# Patient Record
Sex: Female | Born: 1980 | Race: White | Hispanic: No | Marital: Married | Smoking: Never smoker
Health system: Southern US, Community
[De-identification: ages and names within clinical notes are randomized; demographics above are authoritative.]

## PROBLEM LIST (undated history)

## (undated) DIAGNOSIS — E039 Hypothyroidism, unspecified: Secondary | ICD-10-CM

## (undated) HISTORY — PX: KNEE SURGERY: SHX244

## (undated) HISTORY — PX: CYST EXCISION: SHX5701

---
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
## 2006-04-06 ENCOUNTER — Emergency Department (HOSPITAL_COMMUNITY): Admission: EM | Admit: 2006-04-06 | Discharge: 2006-04-06 | Payer: Self-pay | Admitting: Emergency Medicine

## 2006-06-09 ENCOUNTER — Encounter (INDEPENDENT_AMBULATORY_CARE_PROVIDER_SITE_OTHER): Payer: Self-pay | Admitting: *Deleted

## 2006-06-09 ENCOUNTER — Ambulatory Visit (HOSPITAL_BASED_OUTPATIENT_CLINIC_OR_DEPARTMENT_OTHER): Admission: RE | Admit: 2006-06-09 | Discharge: 2006-06-09 | Payer: Self-pay | Admitting: Surgery

## 2006-12-16 ENCOUNTER — Emergency Department (HOSPITAL_COMMUNITY): Admission: EM | Admit: 2006-12-16 | Discharge: 2006-12-16 | Payer: Self-pay | Admitting: Emergency Medicine

## 2007-07-27 ENCOUNTER — Ambulatory Visit: Payer: Self-pay | Admitting: Oncology

## 2007-08-06 LAB — CBC WITH DIFFERENTIAL/PLATELET
BASO%: 0.5 % (ref 0.0–2.0)
Basophils Absolute: 0 10*3/uL (ref 0.0–0.1)
EOS%: 1.6 % (ref 0.0–7.0)
Eosinophils Absolute: 0.1 10*3/uL (ref 0.0–0.5)
HCT: 37 % (ref 34.8–46.6)
HGB: 13.2 g/dL (ref 11.6–15.9)
LYMPH%: 28.4 % (ref 14.0–48.0)
MCH: 33.1 pg (ref 26.0–34.0)
MCHC: 35.7 g/dL (ref 32.0–36.0)
MCV: 92.6 fL (ref 81.0–101.0)
MONO#: 0.5 10*3/uL (ref 0.1–0.9)
MONO%: 7.2 % (ref 0.0–13.0)
NEUT#: 4.7 10*3/uL (ref 1.5–6.5)
NEUT%: 62.3 % (ref 39.6–76.8)
Platelets: 292 10*3/uL (ref 145–400)
RBC: 4 10*6/uL (ref 3.70–5.32)
RDW: 11.5 % (ref 11.3–14.5)
WBC: 7.6 10*3/uL (ref 3.9–10.0)
lymph#: 2.1 10*3/uL (ref 0.9–3.3)

## 2007-08-06 LAB — COMPREHENSIVE METABOLIC PANEL
ALT: 20 U/L (ref 0–35)
AST: 19 U/L (ref 0–37)
Albumin: 4.6 g/dL (ref 3.5–5.2)
Alkaline Phosphatase: 44 U/L (ref 39–117)
BUN: 12 mg/dL (ref 6–23)
CO2: 24 mEq/L (ref 19–32)
Calcium: 9.5 mg/dL (ref 8.4–10.5)
Chloride: 102 mEq/L (ref 96–112)
Creatinine, Ser: 0.7 mg/dL (ref 0.40–1.20)
Glucose, Bld: 87 mg/dL (ref 70–99)
Potassium: 3.8 mEq/L (ref 3.5–5.3)
Sodium: 138 mEq/L (ref 135–145)
Total Bilirubin: 0.4 mg/dL (ref 0.3–1.2)
Total Protein: 7.8 g/dL (ref 6.0–8.3)

## 2007-08-06 LAB — LACTATE DEHYDROGENASE: LDH: 153 U/L (ref 94–250)

## 2007-08-06 LAB — SEDIMENTATION RATE: Sed Rate: 13 mm/hr (ref 0–22)

## 2007-08-06 LAB — CHCC SMEAR

## 2010-10-19 NOTE — Op Note (Signed)
NAMEJESSICIA, Bonnie Holland             ACCOUNT NO.:  [id number]   MEDICAL RECORD NO.:  [id number]          PATIENT TYPE:  AMB   LOCATION:  DSC                          FACILITY:  [facility]   PHYSICIAN:  Wilmon Arms. Corliss Skains, M.D. DATE OF BIRTH:  [birth year]   DATE OF PROCEDURE:  06/09/2006  DATE OF DISCHARGE:                               OPERATIVE REPORT   PREOPERATIVE DIAGNOSIS:  Recurrent right axillary hidradenitis.   POSTOPERATIVE DIAGNOSIS:  Recurrent right axillary hidradenitis.   PROCEDURE PERFORMED:  Excision of right axillary hidradenitis.   SURGEON:  Wilmon Arms. Tsuei, M.D.   ANESTHESIA:  General.   INDICATIONS:  The patient is a 30 year old female, who has had recurrent  subcutaneous abscesses in both axillae for several months.  These were  treated conservatively with local drainage and p.o. doxycycline.  The  left axilla seems to have cleared up; however, the right axilla  continues to have recurrent abscesses.  She has developed tracking  sinuses posteriorly.  We recommended wide excision of all the infected  axillary tissue.   DESCRIPTION OF PROCEDURE:  The patient was brought to the operating room  and placed in supine position on the operating room table.  After an  adequate level of general anesthesia was obtained, the patient's right  axilla was prepped with Betadine and draped in sterile fashion.  Two  separate indurated areas were identified.  More anteriorly, the skin  sinuses were draining purulent material.  Posteriorly, there was seen to  be some indurated tissue.  A 3-inch incision was made in elliptical  fashion around all of the infected tissue.  Dissection was carried down  into the subcutaneous tissues with cautery.  Wide excision of the  axillary contents was performed around all of the chronically granulated  tissue.  This was continued down to the edge of the pectoralis major.  This specimen was sent for pathologic examination.  The wound was then  thoroughly irrigated and hemostasis was obtained with cautery.  The  wound was closed with a deep layer of 3-0 Vicryl in running fashion.  Horizontal mattress sutures of 3-0 nylon were used to close the skin.  Xeroform and a clean dressing were applied.  The patient was extubated  and brought to the recovery room in stable condition.  All sponge,  instrument and needle counts were correct.      Wilmon Arms. Tsuei, M.D.  Electronically Signed     MKT/MEDQ  D:  06/09/2006  T:  06/09/2006  Job:  [id number]

## 2013-05-11 LAB — OB RESULTS CONSOLE RPR: RPR: NONREACTIVE

## 2013-05-11 LAB — OB RESULTS CONSOLE RUBELLA ANTIBODY, IGM: Rubella: IMMUNE

## 2013-05-11 LAB — OB RESULTS CONSOLE GC/CHLAMYDIA
Chlamydia: NEGATIVE
Gonorrhea: NEGATIVE

## 2013-05-11 LAB — OB RESULTS CONSOLE HEPATITIS B SURFACE ANTIGEN: Hepatitis B Surface Ag: NEGATIVE

## 2013-05-11 LAB — OB RESULTS CONSOLE HIV ANTIBODY (ROUTINE TESTING): HIV: NONREACTIVE

## 2013-05-11 LAB — OB RESULTS CONSOLE ANTIBODY SCREEN: Antibody Screen: NEGATIVE

## 2013-05-11 LAB — OB RESULTS CONSOLE ABO/RH: RH Type: NEGATIVE

## 2013-06-03 NOTE — L&D Delivery Note (Signed)
SVD of VMI at 0050 on 12/14/13.  EBL 300cc.  APGARs 7,9.  Placenta to L&D. NICU was in attendance secondary to chorio and thick, particulate meconium.  Head delivered LOA and body followed atraumatically.  Cord was clamped, cut and baby handed off to NICU at warmer for suction.  Cord gas and blood obtained.  Cord blood was donated.  Placenta delivered S/I/3VC.  Uterine inversion was noted with placental delivery and was promptly reduced within seconds.  2nd degree perineal laceration was repaired with 3-0 Vicryl in the normal fashion.  Mom and baby stable.    Mitchel HonourMegan Xana Bradt, DO

## 2013-06-09 ENCOUNTER — Inpatient Hospital Stay (HOSPITAL_COMMUNITY): Admission: AD | Admit: 2013-06-09 | Payer: Self-pay | Source: Ambulatory Visit | Admitting: Obstetrics and Gynecology

## 2013-07-05 ENCOUNTER — Other Ambulatory Visit: Payer: Self-pay

## 2013-07-08 ENCOUNTER — Other Ambulatory Visit (HOSPITAL_COMMUNITY): Payer: Self-pay | Admitting: Obstetrics and Gynecology

## 2013-07-08 DIAGNOSIS — R772 Abnormality of alphafetoprotein: Secondary | ICD-10-CM

## 2013-07-16 ENCOUNTER — Encounter (HOSPITAL_COMMUNITY): Payer: Self-pay

## 2013-07-16 ENCOUNTER — Ambulatory Visit (HOSPITAL_COMMUNITY)
Admission: RE | Admit: 2013-07-16 | Discharge: 2013-07-16 | Disposition: A | Discharge status: Home / Self Care | Payer: BC Managed Care – PPO | Source: Ambulatory Visit | Attending: Obstetrics and Gynecology | Admitting: Obstetrics and Gynecology

## 2013-07-16 ENCOUNTER — Ambulatory Visit (HOSPITAL_COMMUNITY): Admission: RE | Admit: 2013-07-16 | Payer: BC Managed Care – PPO | Source: Ambulatory Visit

## 2013-07-16 DIAGNOSIS — O358XX0 Maternal care for other (suspected) fetal abnormality and damage, not applicable or unspecified: Secondary | ICD-10-CM | POA: Insufficient documentation

## 2013-07-16 DIAGNOSIS — R772 Abnormality of alphafetoprotein: Secondary | ICD-10-CM

## 2013-07-16 DIAGNOSIS — O289 Unspecified abnormal findings on antenatal screening of mother: Secondary | ICD-10-CM | POA: Insufficient documentation

## 2013-07-16 DIAGNOSIS — O3500X0 Maternal care for (suspected) central nervous system malformation or damage in fetus, unspecified, not applicable or unspecified: Secondary | ICD-10-CM | POA: Insufficient documentation

## 2013-07-16 DIAGNOSIS — O350XX0 Maternal care for (suspected) central nervous system malformation in fetus, not applicable or unspecified: Secondary | ICD-10-CM | POA: Insufficient documentation

## 2013-09-07 ENCOUNTER — Other Ambulatory Visit (HOSPITAL_COMMUNITY): Payer: Self-pay | Admitting: Obstetrics and Gynecology

## 2013-09-07 DIAGNOSIS — O350XX0 Maternal care for (suspected) central nervous system malformation in fetus, not applicable or unspecified: Secondary | ICD-10-CM

## 2013-09-07 DIAGNOSIS — O3500X0 Maternal care for (suspected) central nervous system malformation or damage in fetus, unspecified, not applicable or unspecified: Secondary | ICD-10-CM

## 2013-09-07 DIAGNOSIS — O289 Unspecified abnormal findings on antenatal screening of mother: Secondary | ICD-10-CM

## 2013-09-10 ENCOUNTER — Encounter (HOSPITAL_COMMUNITY): Payer: Self-pay

## 2013-09-10 ENCOUNTER — Other Ambulatory Visit (HOSPITAL_COMMUNITY): Payer: Self-pay | Admitting: Maternal and Fetal Medicine

## 2013-09-10 ENCOUNTER — Ambulatory Visit (HOSPITAL_COMMUNITY)
Admission: RE | Admit: 2013-09-10 | Discharge: 2013-09-10 | Disposition: A | Discharge status: Home / Self Care | Payer: BC Managed Care – PPO | Source: Ambulatory Visit | Attending: Obstetrics and Gynecology | Admitting: Obstetrics and Gynecology

## 2013-09-10 VITALS — BP 115/62 | HR 87 | Wt 201.0 lb

## 2013-09-10 DIAGNOSIS — O350XX0 Maternal care for (suspected) central nervous system malformation in fetus, not applicable or unspecified: Secondary | ICD-10-CM

## 2013-09-10 DIAGNOSIS — O3500X0 Maternal care for (suspected) central nervous system malformation or damage in fetus, unspecified, not applicable or unspecified: Secondary | ICD-10-CM

## 2013-09-10 DIAGNOSIS — O289 Unspecified abnormal findings on antenatal screening of mother: Secondary | ICD-10-CM

## 2013-10-22 ENCOUNTER — Ambulatory Visit (HOSPITAL_COMMUNITY)
Admission: RE | Admit: 2013-10-22 | Discharge: 2013-10-22 | Disposition: A | Discharge status: Home / Self Care | Payer: BC Managed Care – PPO | Source: Ambulatory Visit | Attending: Obstetrics and Gynecology | Admitting: Obstetrics and Gynecology

## 2013-10-22 ENCOUNTER — Encounter (HOSPITAL_COMMUNITY): Payer: Self-pay

## 2013-10-22 DIAGNOSIS — O289 Unspecified abnormal findings on antenatal screening of mother: Secondary | ICD-10-CM | POA: Insufficient documentation

## 2013-10-22 DIAGNOSIS — Z3689 Encounter for other specified antenatal screening: Secondary | ICD-10-CM | POA: Insufficient documentation

## 2013-11-26 LAB — OB RESULTS CONSOLE GBS: GBS: POSITIVE

## 2013-12-12 ENCOUNTER — Inpatient Hospital Stay (HOSPITAL_COMMUNITY)
Admission: AD | Admit: 2013-12-12 | Discharge: 2013-12-15 | DRG: 775 | Disposition: A | Discharge status: Home / Self Care | Payer: BC Managed Care – PPO | Source: Ambulatory Visit | Attending: Obstetrics & Gynecology | Admitting: Obstetrics & Gynecology

## 2013-12-12 ENCOUNTER — Encounter (HOSPITAL_COMMUNITY): Payer: Self-pay | Admitting: *Deleted

## 2013-12-12 DIAGNOSIS — O99284 Endocrine, nutritional and metabolic diseases complicating childbirth: Secondary | ICD-10-CM

## 2013-12-12 DIAGNOSIS — Z22330 Carrier of Group B streptococcus: Secondary | ICD-10-CM

## 2013-12-12 DIAGNOSIS — E079 Disorder of thyroid, unspecified: Secondary | ICD-10-CM | POA: Diagnosis present

## 2013-12-12 DIAGNOSIS — O99892 Other specified diseases and conditions complicating childbirth: Secondary | ICD-10-CM | POA: Diagnosis present

## 2013-12-12 DIAGNOSIS — E039 Hypothyroidism, unspecified: Secondary | ICD-10-CM | POA: Diagnosis present

## 2013-12-12 DIAGNOSIS — O9989 Other specified diseases and conditions complicating pregnancy, childbirth and the puerperium: Secondary | ICD-10-CM

## 2013-12-12 DIAGNOSIS — O479 False labor, unspecified: Secondary | ICD-10-CM | POA: Diagnosis present

## 2013-12-12 DIAGNOSIS — Z3490 Encounter for supervision of normal pregnancy, unspecified, unspecified trimester: Secondary | ICD-10-CM

## 2013-12-12 HISTORY — DX: Hypothyroidism, unspecified: E03.9

## 2013-12-12 LAB — CBC
HCT: 36.9 % (ref 36.0–46.0)
Hemoglobin: 13 g/dL (ref 12.0–15.0)
MCH: 31.5 pg (ref 26.0–34.0)
MCHC: 35.2 g/dL (ref 30.0–36.0)
MCV: 89.3 fL (ref 78.0–100.0)
Platelets: 238 10*3/uL (ref 150–400)
RBC: 4.13 MIL/uL (ref 3.87–5.11)
RDW: 12.7 % (ref 11.5–15.5)
WBC: 16.2 10*3/uL — ABNORMAL HIGH (ref 4.0–10.5)

## 2013-12-12 LAB — POCT FERN TEST: POCT Fern Test: POSITIVE

## 2013-12-12 MED ORDER — ONDANSETRON HCL 4 MG/2ML IJ SOLN
4.0000 mg | Freq: Four times a day (QID) | INTRAMUSCULAR | Status: DC | PRN
Start: 2013-12-12 — End: 2013-12-14
  Administered 2013-12-13 (×2): 4 mg via INTRAVENOUS
  Filled 2013-12-12 (×2): qty 2

## 2013-12-12 MED ORDER — PENICILLIN G POTASSIUM 5000000 UNITS IJ SOLR
2.5000 10*6.[IU] | INTRAVENOUS | Status: DC
  Administered 2013-12-13 (×6): 2.5 10*6.[IU] via INTRAVENOUS
  Filled 2013-12-12 (×11): qty 2.5

## 2013-12-12 MED ORDER — IBUPROFEN 600 MG PO TABS: 600.0000 mg | ORAL_TABLET | Freq: Four times a day (QID) | ORAL | Status: DC | PRN

## 2013-12-12 MED ORDER — LACTATED RINGERS IV SOLN: 500.0000 mL | INTRAVENOUS | Status: DC | PRN

## 2013-12-12 MED ORDER — OXYTOCIN 40 UNITS IN LACTATED RINGERS INFUSION - SIMPLE MED
62.5000 mL/h | INTRAVENOUS | Status: DC
  Administered 2013-12-14: 62.5 mL/h via INTRAVENOUS

## 2013-12-12 MED ORDER — LACTATED RINGERS IV SOLN
INTRAVENOUS | Status: DC
  Administered 2013-12-12 – 2013-12-13 (×4): via INTRAVENOUS

## 2013-12-12 MED ORDER — LIDOCAINE HCL (PF) 1 % IJ SOLN
30.0000 mL | INTRAMUSCULAR | Status: DC | PRN
  Filled 2013-12-12: qty 30

## 2013-12-12 MED ORDER — PENICILLIN G POTASSIUM 5000000 UNITS IJ SOLR
5.0000 10*6.[IU] | Freq: Once | INTRAMUSCULAR | Status: AC
  Administered 2013-12-12: 5 10*6.[IU] via INTRAVENOUS
  Filled 2013-12-12: qty 5

## 2013-12-12 MED ORDER — CITRIC ACID-SODIUM CITRATE 334-500 MG/5ML PO SOLN: 30.0000 mL | ORAL | Status: DC | PRN

## 2013-12-12 MED ORDER — OXYTOCIN BOLUS FROM INFUSION
500.0000 mL | INTRAVENOUS | Status: DC
  Administered 2013-12-14: 500 mL via INTRAVENOUS

## 2013-12-12 MED ORDER — ACETAMINOPHEN 325 MG PO TABS: 650.0000 mg | ORAL_TABLET | ORAL | Status: DC | PRN

## 2013-12-12 NOTE — MAU Note (Signed)
Pt reports ROM at 1845

## 2013-12-13 ENCOUNTER — Encounter (HOSPITAL_COMMUNITY): Payer: Self-pay

## 2013-12-13 ENCOUNTER — Inpatient Hospital Stay (HOSPITAL_COMMUNITY): Payer: BC Managed Care – PPO | Admitting: Anesthesiology

## 2013-12-13 ENCOUNTER — Encounter (HOSPITAL_COMMUNITY): Payer: BC Managed Care – PPO | Admitting: Anesthesiology

## 2013-12-13 LAB — RPR

## 2013-12-13 MED ORDER — GENTAMICIN SULFATE 40 MG/ML IJ SOLN
1.5000 mg/kg | Freq: Three times a day (TID) | INTRAVENOUS | Status: DC
  Administered 2013-12-13: 140 mg via INTRAVENOUS
  Filled 2013-12-13 (×3): qty 3.5

## 2013-12-13 MED ORDER — SODIUM CHLORIDE 0.9 % IV SOLN
2.0000 g | Freq: Four times a day (QID) | INTRAVENOUS | Status: DC
  Administered 2013-12-13: 2 g via INTRAVENOUS
  Filled 2013-12-13 (×3): qty 2000

## 2013-12-13 MED ORDER — FENTANYL 2.5 MCG/ML BUPIVACAINE 1/10 % EPIDURAL INFUSION (WH - ANES)
14.0000 mL/h | INTRAMUSCULAR | Status: DC | PRN
  Administered 2013-12-13 (×4): 14 mL/h via EPIDURAL
  Filled 2013-12-13 (×4): qty 125

## 2013-12-13 MED ORDER — OXYTOCIN 40 UNITS IN LACTATED RINGERS INFUSION - SIMPLE MED
1.0000 m[IU]/min | INTRAVENOUS | Status: DC
  Administered 2013-12-13: 2 m[IU]/min via INTRAVENOUS
  Filled 2013-12-13: qty 1000

## 2013-12-13 MED ORDER — PHENYLEPHRINE 40 MCG/ML (10ML) SYRINGE FOR IV PUSH (FOR BLOOD PRESSURE SUPPORT)
80.0000 ug | PREFILLED_SYRINGE | INTRAVENOUS | Status: DC | PRN
  Filled 2013-12-13: qty 2

## 2013-12-13 MED ORDER — LACTATED RINGERS IV SOLN
500.0000 mL | Freq: Once | INTRAVENOUS | Status: AC
  Administered 2013-12-13: 500 mL via INTRAVENOUS

## 2013-12-13 MED ORDER — PHENYLEPHRINE 40 MCG/ML (10ML) SYRINGE FOR IV PUSH (FOR BLOOD PRESSURE SUPPORT)
80.0000 ug | PREFILLED_SYRINGE | INTRAVENOUS | Status: DC | PRN
  Filled 2013-12-13: qty 2
  Filled 2013-12-13: qty 10

## 2013-12-13 MED ORDER — DIPHENHYDRAMINE HCL 50 MG/ML IJ SOLN: 12.5000 mg | INTRAMUSCULAR | Status: DC | PRN

## 2013-12-13 MED ORDER — EPHEDRINE 5 MG/ML INJ
10.0000 mg | INTRAVENOUS | Status: DC | PRN
  Filled 2013-12-13: qty 2

## 2013-12-13 MED ORDER — LACTATED RINGERS IV SOLN
INTRAVENOUS | Status: DC
Start: 2013-12-13 — End: 2013-12-14
  Administered 2013-12-13 (×3): via INTRAUTERINE

## 2013-12-13 MED ORDER — LIDOCAINE HCL (PF) 1 % IJ SOLN
INTRAMUSCULAR | Status: DC | PRN
  Administered 2013-12-13 (×2): 5 mL

## 2013-12-13 MED ORDER — TERBUTALINE SULFATE 1 MG/ML IJ SOLN: 0.2500 mg | Freq: Once | INTRAMUSCULAR | Status: AC | PRN

## 2013-12-13 NOTE — Anesthesia Procedure Notes (Signed)
Epidural Patient location during procedure: OB Start time: 12/13/2013 5:20 AM  Staffing Anesthesiologist: Brayton CavesJACKSON, Brinae Woods Performed by: anesthesiologist   Preanesthetic Checklist Completed: patient identified, site marked, surgical consent, pre-op evaluation, timeout performed, IV checked, risks and benefits discussed and monitors and equipment checked  Epidural Patient position: sitting Prep: site prepped and draped and DuraPrep Patient monitoring: continuous pulse ox and blood pressure Approach: midline Location: L3-L4 Injection technique: LOR air  Needle:  Needle type: Tuohy  Needle gauge: 17 G Needle length: 9 cm and 9 Needle insertion depth: 6 cm Catheter type: closed end flexible Catheter size: 19 Gauge Catheter at skin depth: 10 cm Test dose: negative  Assessment Events: blood not aspirated, injection not painful, no injection resistance, negative IV test and no paresthesia  Additional Notes Patient identified.  Risk benefits discussed including failed block, incomplete pain control, headache, nerve damage, paralysis, blood pressure changes, nausea, vomiting, reactions to medication both toxic or allergic, and postpartum back pain.  Patient expressed understanding and wished to proceed.  All questions were answered.  Sterile technique used throughout procedure and epidural site dressed with sterile barrier dressing. No paresthesia or other complications noted.The patient did not experience any signs of intravascular injection such as tinnitus or metallic taste in mouth nor signs of intrathecal spread such as rapid motor block. Please see nursing notes for vital signs.

## 2013-12-13 NOTE — Progress Notes (Signed)
Bonnie HaggardKimberly M Holland is a 33 y.o. G1P0000 at 9216w1d by ultrasound admitted for rupture of membranes  Subjective: No c/o.  Comfortable with epidural.  Objective: BP 111/55  Pulse 84  Temp(Src) 98.7 F (37.1 C) (Oral)  Resp 16  Ht 5\' 4"  (1.626 m)  Wt 212 lb (96.163 kg)  BMI 36.37 kg/m2  SpO2 98%  LMP 03/14/2013      FHT:  FHR: 135 bpm, variability: moderate,  accelerations:  Present,  decelerations:  Present early decelerations UC:   regular, every 4 minutes SVE:   Dilation: 1.5 Effacement (%): 70 Station: -2 Exam by:: E.Clapper, RN  Labs: Lab Results  Component Value Date   WBC 16.2* 12/12/2013   HGB 13.0 12/12/2013   HCT 36.9 12/12/2013   MCV 89.3 12/12/2013   PLT 238 12/12/2013    Assessment / Plan: Protracted latent phase  Labor: Will add pitocin Preeclampsia:  n/a Fetal Wellbeing:  Category II Pain Control:  Epidural I/D:  n/a Anticipated MOD:  NSVD  Lyra Alaimo 12/13/2013, 7:42 AM

## 2013-12-13 NOTE — Anesthesia Preprocedure Evaluation (Signed)
Anesthesia Evaluation  Patient identified by MRN, date of birth, ID band Patient awake    Reviewed: Allergy & Precautions, H&P , Patient's Chart, lab work & pertinent test results  Airway Mallampati: III TM Distance: >3 FB Neck ROM: full    Dental   Pulmonary  breath sounds clear to auscultation        Cardiovascular Rhythm:regular Rate:Normal     Neuro/Psych    GI/Hepatic   Endo/Other    Renal/GU      Musculoskeletal   Abdominal   Peds  Hematology   Anesthesia Other Findings   Reproductive/Obstetrics (+) Pregnancy                           Anesthesia Physical Anesthesia Plan  ASA: II  Anesthesia Plan: Epidural   Post-op Pain Management:    Induction:   Airway Management Planned:   Additional Equipment:   Intra-op Plan:   Post-operative Plan:   Informed Consent: I have reviewed the patients History and Physical, chart, labs and discussed the procedure including the risks, benefits and alternatives for the proposed anesthesia with the patient or authorized representative who has indicated his/her understanding and acceptance.     Plan Discussed with:   Anesthesia Plan Comments:         Anesthesia Quick Evaluation  

## 2013-12-13 NOTE — H&P (Signed)
Bonnie HaggardKimberly M Holland is a 33 y.o. female G1P0 @ 39wks presenting for ROM/labor.    History OB History   Grav Para Term Preterm Abortions TAB SAB Ect Mult Living   1 0 0 0 0 0 0 0 0 0      Past Medical History  Diagnosis Date  . Hypothyroidism    Past Surgical History  Procedure Laterality Date  . Knee surgery    . Cyst excision     Family History: family history is not on file. Social History:  reports that she has never smoked. She does not have any smokeless tobacco history on file. She reports that she does not drink alcohol or use illicit drugs.   Prenatal Transfer Tool  Maternal Diabetes: No Genetic Screening: Abnormal:  Results: Elevated AFP, Other: normal cfDNA Maternal Ultrasounds/Referrals: Normal Fetal Ultrasounds or other Referrals:  None Maternal Substance Abuse:  No Significant Maternal Medications:  None Significant Maternal Lab Results:  None Other Comments:  None  ROS  Dilation: Fingertip Effacement (%): 80 Station: -2 Exam by:: Weston,RN Blood pressure 128/72, pulse 80, temperature 98.5 F (36.9 C), temperature source Oral, resp. rate 16, height 5\' 4"  (1.626 m), weight 96.163 kg (212 lb), last menstrual period 03/14/2013. Exam Physical Exam  Gen - NAD Abd - gravid, NT  EFW 7# Ext NT, trace edema bilaterally Cvx 1cm Prenatal labs: ABO, Rh: --/--/B NEG (07/12 1954) Antibody: PENDING (07/12 1954) Rubella: Immune (12/09 0000) RPR: NON REAC (07/12 1954)  HBsAg: Negative (12/09 0000)  HIV: Non-reactive (12/09 0000)  GBS: Positive (06/26 0000)   Assessment/Plan: Labor PCN for GBS prophylaxis Epidural upon request   Noela Brothers 12/13/2013, 3:48 AM

## 2013-12-14 ENCOUNTER — Encounter (HOSPITAL_COMMUNITY): Payer: Self-pay

## 2013-12-14 DIAGNOSIS — Z3490 Encounter for supervision of normal pregnancy, unspecified, unspecified trimester: Secondary | ICD-10-CM

## 2013-12-14 LAB — CBC
HCT: 33.2 % — ABNORMAL LOW (ref 36.0–46.0)
Hemoglobin: 11.5 g/dL — ABNORMAL LOW (ref 12.0–15.0)
MCH: 31.3 pg (ref 26.0–34.0)
MCHC: 34.6 g/dL (ref 30.0–36.0)
MCV: 90.5 fL (ref 78.0–100.0)
Platelets: 179 10*3/uL (ref 150–400)
RBC: 3.67 MIL/uL — ABNORMAL LOW (ref 3.87–5.11)
RDW: 12.8 % (ref 11.5–15.5)
WBC: 27.7 10*3/uL — ABNORMAL HIGH (ref 4.0–10.5)

## 2013-12-14 MED ORDER — ONDANSETRON HCL 4 MG PO TABS: 4.0000 mg | ORAL_TABLET | ORAL | Status: DC | PRN

## 2013-12-14 MED ORDER — ZOLPIDEM TARTRATE 5 MG PO TABS: 5.0000 mg | ORAL_TABLET | Freq: Every evening | ORAL | Status: DC | PRN

## 2013-12-14 MED ORDER — LANOLIN HYDROUS EX OINT: TOPICAL_OINTMENT | CUTANEOUS | Status: DC | PRN

## 2013-12-14 MED ORDER — IBUPROFEN 600 MG PO TABS
600.0000 mg | ORAL_TABLET | Freq: Four times a day (QID) | ORAL | Status: DC
  Administered 2013-12-14 – 2013-12-15 (×6): 600 mg via ORAL
  Filled 2013-12-14 (×6): qty 1

## 2013-12-14 MED ORDER — SENNOSIDES-DOCUSATE SODIUM 8.6-50 MG PO TABS
2.0000 | ORAL_TABLET | ORAL | Status: DC
  Administered 2013-12-14: 2 via ORAL
  Filled 2013-12-14: qty 2

## 2013-12-14 MED ORDER — DIBUCAINE 1 % RE OINT: 1.0000 "application " | TOPICAL_OINTMENT | RECTAL | Status: DC | PRN

## 2013-12-14 MED ORDER — TETANUS-DIPHTH-ACELL PERTUSSIS 5-2.5-18.5 LF-MCG/0.5 IM SUSP: 0.5000 mL | Freq: Once | INTRAMUSCULAR | Status: DC

## 2013-12-14 MED ORDER — WITCH HAZEL-GLYCERIN EX PADS: 1.0000 "application " | MEDICATED_PAD | CUTANEOUS | Status: DC | PRN

## 2013-12-14 MED ORDER — SIMETHICONE 80 MG PO CHEW: 80.0000 mg | CHEWABLE_TABLET | ORAL | Status: DC | PRN

## 2013-12-14 MED ORDER — OXYCODONE-ACETAMINOPHEN 5-325 MG PO TABS: 1.0000 | ORAL_TABLET | ORAL | Status: DC | PRN

## 2013-12-14 MED ORDER — PRENATAL MULTIVITAMIN CH
1.0000 | ORAL_TABLET | Freq: Every day | ORAL | Status: DC
  Administered 2013-12-14 – 2013-12-15 (×2): 1 via ORAL
  Filled 2013-12-14 (×2): qty 1

## 2013-12-14 MED ORDER — BENZOCAINE-MENTHOL 20-0.5 % EX AERO
1.0000 "application " | INHALATION_SPRAY | CUTANEOUS | Status: DC | PRN
  Administered 2013-12-14: 1 via TOPICAL
  Filled 2013-12-14: qty 56

## 2013-12-14 MED ORDER — SODIUM CHLORIDE 0.9 % IV SOLN
2.0000 g | Freq: Once | INTRAVENOUS | Status: AC
  Administered 2013-12-14: 2 g via INTRAVENOUS
  Filled 2013-12-14: qty 2000

## 2013-12-14 MED ORDER — DIPHENHYDRAMINE HCL 25 MG PO CAPS: 25.0000 mg | ORAL_CAPSULE | Freq: Four times a day (QID) | ORAL | Status: DC | PRN

## 2013-12-14 MED ORDER — ONDANSETRON HCL 4 MG/2ML IJ SOLN: 4.0000 mg | INTRAMUSCULAR | Status: DC | PRN

## 2013-12-14 MED ORDER — GENTAMICIN SULFATE 40 MG/ML IJ SOLN
1.5000 mg/kg | Freq: Once | INTRAVENOUS | Status: AC
  Administered 2013-12-14: 140 mg via INTRAVENOUS
  Filled 2013-12-14: qty 3.5

## 2013-12-14 MED ORDER — LEVOTHYROXINE SODIUM 25 MCG PO TABS
25.0000 ug | ORAL_TABLET | Freq: Every day | ORAL | Status: DC
  Administered 2013-12-14 – 2013-12-15 (×2): 25 ug via ORAL
  Filled 2013-12-14 (×3): qty 1

## 2013-12-14 MED ORDER — RHO D IMMUNE GLOBULIN 1500 UNIT/2ML IJ SOSY
300.0000 ug | PREFILLED_SYRINGE | Freq: Once | INTRAMUSCULAR | Status: AC
  Administered 2013-12-14: 300 ug via INTRAMUSCULAR
  Filled 2013-12-14: qty 2

## 2013-12-14 NOTE — Anesthesia Postprocedure Evaluation (Signed)
  Anesthesia Post-op Note  Patient: Bonnie Holland  Procedure(s) Performed: * No procedures listed *  Patient Location: Mother/Baby  Anesthesia Type:Epidural  Level of Consciousness: awake, alert , oriented and patient cooperative  Airway and Oxygen Therapy: Patient Spontanous Breathing  Post-op Pain: none  Post-op Assessment: Post-op Vital signs reviewed, Patient's Cardiovascular Status Stable, Respiratory Function Stable, Patent Airway, No signs of Nausea or vomiting, Adequate PO intake, Pain level controlled, Pain level not controlled and No headache  Post-op Vital Signs: Reviewed and stable  Last Vitals:  Filed Vitals:   12/14/13 0430  BP: 109/75  Pulse: 76  Temp: 36.8 C  Resp: 20    Complications: No apparent anesthesia complications

## 2013-12-14 NOTE — Progress Notes (Signed)
Post Partum Day 0 Subjective: no complaints, up ad lib, voiding and tolerating PO  Objective: Blood pressure 114/69, pulse 76, temperature 97.6 F (36.4 C), temperature source Oral, resp. rate 18, height 5\' 4"  (1.626 m), weight 212 lb (96.163 kg), last menstrual period 03/14/2013, SpO2 98.00%, unknown if currently breastfeeding.  Physical Exam:  General: alert Lochia: appropriate Uterine Fundus: firm Incision: perineum intact DVT Evaluation: No evidence of DVT seen on physical exam. Negative Homan's sign. No cords or calf tenderness. No significant calf/ankle edema.   Recent Labs  12/12/13 1954 12/14/13 0615  HGB 13.0 11.5*  HCT 36.9 33.2*    Assessment/Plan: Plan for discharge tomorrow and Circumcision prior to discharge   LOS: 2 days   Jennyfer Nickolson G 12/14/2013, 9:00 AM

## 2013-12-15 LAB — CBC
HCT: 33.3 % — ABNORMAL LOW (ref 36.0–46.0)
Hemoglobin: 11.4 g/dL — ABNORMAL LOW (ref 12.0–15.0)
MCH: 31.4 pg (ref 26.0–34.0)
MCHC: 34.2 g/dL (ref 30.0–36.0)
MCV: 91.7 fL (ref 78.0–100.0)
Platelets: 189 10*3/uL (ref 150–400)
RBC: 3.63 MIL/uL — ABNORMAL LOW (ref 3.87–5.11)
RDW: 13 % (ref 11.5–15.5)
WBC: 14.5 10*3/uL — ABNORMAL HIGH (ref 4.0–10.5)

## 2013-12-15 LAB — RH IG WORKUP (INCLUDES ABO/RH)
ABO/RH(D): B NEG
Fetal Screen: NEGATIVE
Gestational Age(Wks): 39.2
Unit division: 0

## 2013-12-15 MED ORDER — IBUPROFEN 600 MG PO TABS: 600.0000 mg | ORAL_TABLET | Freq: Four times a day (QID) | ORAL | Status: AC

## 2013-12-15 NOTE — Discharge Summary (Signed)
Obstetric Discharge Summary Reason for Admission: rupture of membranes Prenatal Procedures: ultrasound Intrapartum Procedures: spontaneous vaginal delivery Postpartum Procedures: none Complications-Operative and Postpartum: 2 degree perineal laceration HGB  Date Value Ref Range Status  08/06/2007 13.2  11.6 - 15.9 g/dL Final     Hemoglobin  Date Value Ref Range Status  12/14/2013 11.5* 12.0 - 15.0 g/dL Final     HCT  Date Value Ref Range Status  12/14/2013 33.2* 36.0 - 46.0 % Final  08/06/2007 37.0  34.8 - 46.6 % Final    Physical Exam:  General: alert and cooperative Lochia: appropriate terine Fundus: firm, non- tender Incision: perineum intact DVT Evaluation: No evidence of DVT seen on physical exam. Negative Homan's sign. No cords or calf tenderness. No significant calf/ankle edema.  Discharge Diagnoses: Term Pregnancy-delivered  Discharge Information: Date: 12/15/2013 Activity: pelvic rest Diet: routine Medications: PNV and Ibuprofen Condition: stable Instructions: refer to practice specific booklet Discharge to: home   Newborn Data: Live born female  Birth Weight: 7 lb 1.9 oz (3229 g) APGAR: 7, 9  Home with mother.  Roel Douthat G 12/15/2013, 8:43 AM

## 2013-12-15 NOTE — Progress Notes (Signed)
Post Partum Day 1 Subjective: up ad lib, voiding, tolerating PO and desires early discharge  Objective: Blood pressure 105/60, pulse 58, temperature 97.7 F (36.5 C), temperature source Oral, resp. rate 18, height 5\' 4"  (1.626 m), weight 212 lb (96.163 kg), last menstrual period 03/14/2013, SpO2 98.00%, unknown if currently breastfeeding.  Physical Exam:  General: alert and cooperative Lochia: appropriate Uterine Fundus: firm Incision: perineum intact DVT Evaluation: No evidence of DVT seen on physical exam. Negative Homan's sign. No cords or calf tenderness. No significant calf/ankle edema.   Recent Labs  12/12/13 1954 12/14/13 0615  HGB 13.0 11.5*  HCT 36.9 33.2*    Assessment/Plan: Discharge home Cbc ordered   LOS: 3 days   Bonnie Holland G 12/15/2013, 8:32 AM

## 2013-12-16 LAB — TYPE AND SCREEN
ABO/RH(D): B NEG
Antibody Screen: POSITIVE
DAT, IgG: NEGATIVE
Unit division: 0

## 2013-12-17 LAB — CORD BLOOD GAS (ARTERIAL)

## 2013-12-20 ENCOUNTER — Ambulatory Visit (HOSPITAL_COMMUNITY)
Admit: 2013-12-20 | Discharge: 2013-12-20 | Disposition: A | Discharge status: Home / Self Care | Payer: BC Managed Care – PPO | Attending: Obstetrics & Gynecology | Admitting: Obstetrics & Gynecology

## 2013-12-20 NOTE — Lactation Note (Addendum)
Lactation Consult  Mother's reason for visit:  Latch check volume check Visit Type:  Outpatient  Appointment Notes:  Romeo Apple is gaining but is being supplemented. He is sleepy at the breast. Feedings typically last 30-45 minutes.  He starts feedings with long tugs but quickly fall asleep and has to be stimulated to continue.  He was on the lt breast for 12 minutes and transferred 2 ml. He was then transferred to the rt and more swallows were heard but he only transferred 14.  Repositioned him on the left breast and applied an SNS to see if increased volume prompted him to suckle deeper and it did.  He took 16 ml of BM and 6 ounces of formula via  syringe SNS.  We were  Unable to latch him with it a second time and mom did not want to try the full sized SNS so we stopped.  He took an additional  23 ml  via BO). His labial frenum inserts at the alveolar ridge.  He also has a bubble palate and the tip of his tongue is square with certain movements suggesting a posterior tongue tie.  I only discussed the labial frenum with her.  He is able to create a vacuum because he transferred from the SNS.  My hope is that as mom's supply increases he will eat more at the breast.  Post pumping yielded 10 ml.Plan for now feed Ben and increase supply.  Mom also plans to have her tyroid levels checked. Follow-up in one week Consult:  Initial Lactation Consultant:  Soyla Dryer  ________________________________________________________________________ Joan Flores Name: Ival Bible  Date of Birth: 12/14/2013  Pediatrician: Carmon Ginsberg  Gender: female  Gestational Age: [redacted]w[redacted]d (At Birth)  Birth Weight: 7 lb 1.9 oz (3229 g)  Weight at Discharge: Weight: 6 lb 13.4 oz (3100 g) Date of Discharge: 12/15/2013  [facility] Weights   12/14/13 0050 12/15/13 0034  Weight: 7 lb 1.9 oz (3229 g) 6 lb 13.4 oz (3100 g)  [facility] 6+10 Location:Pediatrician's office 12/18/13 Weight today:  6+12.9    ________________________________________________________________________  Mother's Name: Remi Haggard Type of delivery:  Vaginal Breastfeeding Experience:  First baby Maternal Medical Conditions:  Being treated for hypothroid Maternal Medications:  Thyroid meds, PNV, Motrin  ________________________________________________________________________  Breastfeeding History (Post Discharge)  Frequency of breastfeeding:  About 12 times in 24 hours Duration of feeding:  30-60 minutes  Supplementation  Formula:  Volume ml Frequency:  3-4 feedings Total volume per day:  3 oz       Brand: Enfamil  Breastmilk:  Volume 1 ounce  Frequency:  2-3 times in 24 hours Total volume per day:  2-3 ounces  Method:  Bottle,   Pumping  Type of pump:  SPectra Frequency:  3-4 times a day Volume:  25 ml  Infant Intake and Output Assessment  Voids:  8 in 24 hrs.  Color:  Clear yellow Stools:  5 in 24 hrs.  Color:  Brown greenish  ________________________________________________________________________  Maternal Breast Assessment  Breast:  Soft Nipple:  Erect Pain level:  0 Pain interventions:  NA  _______________________________________________________________________ Feeding Assessment/Evaluation  Initial feeding assessment:  Infant's oral assessment:  Variance see note  Positioning:  Cross cradle Left breast  LATCH documentation:  Latch:  1 = Repeated attempts needed to sustain latch, nipple held in mouth throughout feeding, stimulation needed to elicit sucking reflex.  Audible swallowing:  1 = A few with stimulation  Type of nipple:  2 = Everted at rest and after stimulation  Comfort (Breast/Nipple):  2 = Soft / non-tender  Hold (Positioning):  1 = Assistance needed to correctly position infant at breast and maintain latch  LATCH score:  7  Attached assessment:    Lips flanged:  Yes.  needed adjusting  Lips untucked:  Yes.    Suck assessment:  Displays  both  Tools:  Syringe with 5 Fr feeding tube Instructed on use and cleaning of tool:  No. Mom did not like this  Pre-feed weight:  3088 g   Post-feed weight:  3090 g  Amount transferred:  12 ml Amount supplemented:    Additional Feeding Assessment -     Positioning:  Football Right breast  LATCH documentation:  Latch:  2 = Grasps breast easily, tongue down, lips flanged, rhythmical sucking.  Audible swallowing:  2 = Spontaneous and intermittent  Type of nipple:  2 = Everted at rest and after stimulation  Comfort (Breast/Nipple):  2 = Soft / non-tender  Hold (Positioning):  1 = Assistance needed to correctly position infant at breast and maintain latch minimal  LATCH score:  9  Attached assessment:  Shallow transferred more this way.  He suckled poorly with mouth full of breast tissue  Lips flanged:  Yes.  needed assistance  Lips untucked:  Yes.    Suck assessment:  Displays both    Pre-feed weight:  3090 g   Post-feed weight:  3104 g  Amount transferred:  14 ml Amount supplemented:   Repositioned on the left breast Applied SNS and he took 22 ml of supplement   Total amount pumped post feed:  10  Total amount transferred:  16 ml Total supplement given:  66 ml

## 2013-12-27 ENCOUNTER — Ambulatory Visit (HOSPITAL_COMMUNITY): Admission: RE | Admit: 2013-12-27 | Payer: BC Managed Care – PPO | Source: Ambulatory Visit

## 2014-01-20 ENCOUNTER — Other Ambulatory Visit: Payer: Self-pay | Admitting: Obstetrics & Gynecology

## 2014-01-21 LAB — CYTOLOGY - PAP

## 2014-04-04 ENCOUNTER — Encounter (HOSPITAL_COMMUNITY): Payer: Self-pay

## 2015-11-08 DIAGNOSIS — E039 Hypothyroidism, unspecified: Secondary | ICD-10-CM | POA: Diagnosis not present

## 2015-12-28 DIAGNOSIS — E039 Hypothyroidism, unspecified: Secondary | ICD-10-CM | POA: Diagnosis not present

## 2016-06-05 DIAGNOSIS — Z01419 Encounter for gynecological examination (general) (routine) without abnormal findings: Secondary | ICD-10-CM | POA: Diagnosis not present

## 2016-06-05 DIAGNOSIS — Z6835 Body mass index (BMI) 35.0-35.9, adult: Secondary | ICD-10-CM | POA: Diagnosis not present

## 2016-06-12 DIAGNOSIS — E039 Hypothyroidism, unspecified: Secondary | ICD-10-CM | POA: Diagnosis not present

## 2016-06-12 DIAGNOSIS — Z131 Encounter for screening for diabetes mellitus: Secondary | ICD-10-CM | POA: Diagnosis not present

## 2016-06-12 DIAGNOSIS — Z8249 Family history of ischemic heart disease and other diseases of the circulatory system: Secondary | ICD-10-CM | POA: Diagnosis not present

## 2017-02-19 DIAGNOSIS — E039 Hypothyroidism, unspecified: Secondary | ICD-10-CM | POA: Diagnosis not present

## 2017-04-01 DIAGNOSIS — E039 Hypothyroidism, unspecified: Secondary | ICD-10-CM | POA: Diagnosis not present

## 2017-06-26 DIAGNOSIS — L218 Other seborrheic dermatitis: Secondary | ICD-10-CM | POA: Diagnosis not present

## 2017-07-11 DIAGNOSIS — L732 Hidradenitis suppurativa: Secondary | ICD-10-CM | POA: Diagnosis not present

## 2017-07-25 DIAGNOSIS — Z0000 Encounter for general adult medical examination without abnormal findings: Secondary | ICD-10-CM | POA: Diagnosis not present

## 2017-09-09 DIAGNOSIS — Z6835 Body mass index (BMI) 35.0-35.9, adult: Secondary | ICD-10-CM | POA: Diagnosis not present

## 2017-09-09 DIAGNOSIS — Z01419 Encounter for gynecological examination (general) (routine) without abnormal findings: Secondary | ICD-10-CM | POA: Diagnosis not present

## 2017-12-12 DIAGNOSIS — Z1231 Encounter for screening mammogram for malignant neoplasm of breast: Secondary | ICD-10-CM | POA: Diagnosis not present

## 2017-12-15 ENCOUNTER — Other Ambulatory Visit: Payer: Self-pay | Admitting: Obstetrics and Gynecology

## 2017-12-15 DIAGNOSIS — R928 Other abnormal and inconclusive findings on diagnostic imaging of breast: Secondary | ICD-10-CM

## 2017-12-17 ENCOUNTER — Ambulatory Visit
Admission: RE | Admit: 2017-12-17 | Discharge: 2017-12-17 | Disposition: A | Discharge status: Home / Self Care | Payer: BLUE CROSS/BLUE SHIELD | Source: Ambulatory Visit | Attending: Obstetrics and Gynecology | Admitting: Obstetrics and Gynecology

## 2017-12-17 ENCOUNTER — Other Ambulatory Visit: Payer: Self-pay | Admitting: Obstetrics and Gynecology

## 2017-12-17 DIAGNOSIS — R928 Other abnormal and inconclusive findings on diagnostic imaging of breast: Secondary | ICD-10-CM | POA: Diagnosis not present

## 2017-12-17 DIAGNOSIS — N6489 Other specified disorders of breast: Secondary | ICD-10-CM | POA: Diagnosis not present

## 2018-03-23 ENCOUNTER — Other Ambulatory Visit: Payer: Self-pay | Admitting: Radiology

## 2018-03-23 DIAGNOSIS — N6310 Unspecified lump in the right breast, unspecified quadrant: Secondary | ICD-10-CM

## 2018-03-23 DIAGNOSIS — N6459 Other signs and symptoms in breast: Secondary | ICD-10-CM | POA: Diagnosis not present

## 2018-03-26 ENCOUNTER — Ambulatory Visit
Admission: RE | Admit: 2018-03-26 | Discharge: 2018-03-26 | Disposition: A | Discharge status: Home / Self Care | Payer: BLUE CROSS/BLUE SHIELD | Source: Ambulatory Visit | Attending: Radiology | Admitting: Radiology

## 2018-03-26 DIAGNOSIS — N6310 Unspecified lump in the right breast, unspecified quadrant: Secondary | ICD-10-CM

## 2018-03-26 DIAGNOSIS — N6489 Other specified disorders of breast: Secondary | ICD-10-CM | POA: Diagnosis not present

## 2018-03-26 DIAGNOSIS — R928 Other abnormal and inconclusive findings on diagnostic imaging of breast: Secondary | ICD-10-CM | POA: Diagnosis not present

## 2018-04-24 DIAGNOSIS — L218 Other seborrheic dermatitis: Secondary | ICD-10-CM | POA: Diagnosis not present

## 2018-06-02 DIAGNOSIS — R062 Wheezing: Secondary | ICD-10-CM | POA: Diagnosis not present

## 2018-06-02 DIAGNOSIS — J189 Pneumonia, unspecified organism: Secondary | ICD-10-CM | POA: Diagnosis not present

## 2018-06-02 DIAGNOSIS — R0989 Other specified symptoms and signs involving the circulatory and respiratory systems: Secondary | ICD-10-CM | POA: Diagnosis not present

## 2018-06-05 DIAGNOSIS — R062 Wheezing: Secondary | ICD-10-CM | POA: Diagnosis not present

## 2018-06-05 DIAGNOSIS — J189 Pneumonia, unspecified organism: Secondary | ICD-10-CM | POA: Diagnosis not present

## 2018-06-05 DIAGNOSIS — R0989 Other specified symptoms and signs involving the circulatory and respiratory systems: Secondary | ICD-10-CM | POA: Diagnosis not present

## 2018-06-18 DIAGNOSIS — R0989 Other specified symptoms and signs involving the circulatory and respiratory systems: Secondary | ICD-10-CM | POA: Diagnosis not present

## 2018-06-18 DIAGNOSIS — R062 Wheezing: Secondary | ICD-10-CM | POA: Diagnosis not present

## 2018-06-18 DIAGNOSIS — J189 Pneumonia, unspecified organism: Secondary | ICD-10-CM | POA: Diagnosis not present

## 2018-06-29 ENCOUNTER — Ambulatory Visit
Admission: RE | Admit: 2018-06-29 | Discharge: 2018-06-29 | Disposition: A | Discharge status: Home / Self Care | Payer: BLUE CROSS/BLUE SHIELD | Source: Ambulatory Visit | Attending: Obstetrics and Gynecology | Admitting: Obstetrics and Gynecology

## 2018-06-29 ENCOUNTER — Other Ambulatory Visit: Payer: Self-pay | Admitting: Obstetrics and Gynecology

## 2018-06-29 DIAGNOSIS — N6489 Other specified disorders of breast: Secondary | ICD-10-CM

## 2018-06-29 DIAGNOSIS — R928 Other abnormal and inconclusive findings on diagnostic imaging of breast: Secondary | ICD-10-CM | POA: Diagnosis not present

## 2018-10-13 DIAGNOSIS — E039 Hypothyroidism, unspecified: Secondary | ICD-10-CM | POA: Diagnosis not present

## 2018-10-13 DIAGNOSIS — Z13220 Encounter for screening for lipoid disorders: Secondary | ICD-10-CM | POA: Diagnosis not present

## 2018-10-13 DIAGNOSIS — Z131 Encounter for screening for diabetes mellitus: Secondary | ICD-10-CM | POA: Diagnosis not present

## 2018-10-13 DIAGNOSIS — Z6836 Body mass index (BMI) 36.0-36.9, adult: Secondary | ICD-10-CM | POA: Diagnosis not present

## 2018-10-13 DIAGNOSIS — Z01419 Encounter for gynecological examination (general) (routine) without abnormal findings: Secondary | ICD-10-CM | POA: Diagnosis not present

## 2018-12-28 ENCOUNTER — Other Ambulatory Visit: Payer: Self-pay

## 2018-12-28 DIAGNOSIS — Z20822 Contact with and (suspected) exposure to covid-19: Secondary | ICD-10-CM

## 2018-12-30 LAB — NOVEL CORONAVIRUS, NAA: SARS-CoV-2, NAA: NOT DETECTED

## 2019-01-11 ENCOUNTER — Other Ambulatory Visit: Payer: Self-pay

## 2019-01-11 DIAGNOSIS — Z20822 Contact with and (suspected) exposure to covid-19: Secondary | ICD-10-CM

## 2019-01-13 LAB — NOVEL CORONAVIRUS, NAA: SARS-CoV-2, NAA: NOT DETECTED

## 2019-01-18 ENCOUNTER — Ambulatory Visit
Admission: RE | Admit: 2019-01-18 | Discharge: 2019-01-18 | Disposition: A | Discharge status: Home / Self Care | Payer: BC Managed Care – PPO | Source: Ambulatory Visit | Attending: Obstetrics and Gynecology | Admitting: Obstetrics and Gynecology

## 2019-01-18 ENCOUNTER — Other Ambulatory Visit: Payer: Self-pay

## 2019-01-18 DIAGNOSIS — R928 Other abnormal and inconclusive findings on diagnostic imaging of breast: Secondary | ICD-10-CM | POA: Diagnosis not present

## 2019-01-18 DIAGNOSIS — N6489 Other specified disorders of breast: Secondary | ICD-10-CM

## 2019-01-19 ENCOUNTER — Other Ambulatory Visit: Payer: Self-pay

## 2019-01-19 DIAGNOSIS — Z20822 Contact with and (suspected) exposure to covid-19: Secondary | ICD-10-CM

## 2019-01-20 LAB — NOVEL CORONAVIRUS, NAA: SARS-CoV-2, NAA: NOT DETECTED

## 2019-03-25 ENCOUNTER — Other Ambulatory Visit: Payer: Self-pay

## 2019-03-25 DIAGNOSIS — Z20822 Contact with and (suspected) exposure to covid-19: Secondary | ICD-10-CM

## 2019-03-25 DIAGNOSIS — Z20828 Contact with and (suspected) exposure to other viral communicable diseases: Secondary | ICD-10-CM | POA: Diagnosis not present

## 2019-03-27 LAB — NOVEL CORONAVIRUS, NAA: SARS-CoV-2, NAA: NOT DETECTED

## 2019-05-19 DIAGNOSIS — R253 Fasciculation: Secondary | ICD-10-CM | POA: Diagnosis not present

## 2019-05-19 DIAGNOSIS — H6982 Other specified disorders of Eustachian tube, left ear: Secondary | ICD-10-CM | POA: Diagnosis not present

## 2019-05-21 DIAGNOSIS — L218 Other seborrheic dermatitis: Secondary | ICD-10-CM | POA: Diagnosis not present

## 2019-05-21 DIAGNOSIS — L732 Hidradenitis suppurativa: Secondary | ICD-10-CM | POA: Diagnosis not present

## 2019-06-29 ENCOUNTER — Ambulatory Visit: Payer: BC Managed Care – PPO | Attending: Internal Medicine

## 2019-06-29 DIAGNOSIS — Z20822 Contact with and (suspected) exposure to covid-19: Secondary | ICD-10-CM | POA: Diagnosis not present

## 2019-06-30 ENCOUNTER — Other Ambulatory Visit: Payer: BC Managed Care – PPO

## 2019-06-30 LAB — NOVEL CORONAVIRUS, NAA: SARS-CoV-2, NAA: NOT DETECTED

## 2019-10-14 DIAGNOSIS — Z6834 Body mass index (BMI) 34.0-34.9, adult: Secondary | ICD-10-CM | POA: Diagnosis not present

## 2019-10-14 DIAGNOSIS — Z01419 Encounter for gynecological examination (general) (routine) without abnormal findings: Secondary | ICD-10-CM | POA: Diagnosis not present

## 2019-10-21 DIAGNOSIS — Z13220 Encounter for screening for lipoid disorders: Secondary | ICD-10-CM | POA: Diagnosis not present

## 2019-10-21 DIAGNOSIS — Z131 Encounter for screening for diabetes mellitus: Secondary | ICD-10-CM | POA: Diagnosis not present

## 2019-10-21 DIAGNOSIS — Z130 Encounter for screening for diseases of the blood and blood-forming organs and certain disorders involving the immune mechanism: Secondary | ICD-10-CM | POA: Diagnosis not present

## 2019-10-21 DIAGNOSIS — E039 Hypothyroidism, unspecified: Secondary | ICD-10-CM | POA: Diagnosis not present

## 2019-10-21 DIAGNOSIS — Z1321 Encounter for screening for nutritional disorder: Secondary | ICD-10-CM | POA: Diagnosis not present

## 2019-10-21 DIAGNOSIS — Z13228 Encounter for screening for other metabolic disorders: Secondary | ICD-10-CM | POA: Diagnosis not present

## 2019-12-08 ENCOUNTER — Other Ambulatory Visit: Payer: Self-pay | Admitting: Obstetrics and Gynecology

## 2019-12-08 DIAGNOSIS — Z1231 Encounter for screening mammogram for malignant neoplasm of breast: Secondary | ICD-10-CM

## 2020-01-19 ENCOUNTER — Other Ambulatory Visit: Payer: Self-pay

## 2020-01-19 ENCOUNTER — Ambulatory Visit
Admission: RE | Admit: 2020-01-19 | Discharge: 2020-01-19 | Disposition: A | Discharge status: Home / Self Care | Payer: BC Managed Care – PPO | Source: Ambulatory Visit | Attending: Obstetrics and Gynecology | Admitting: Obstetrics and Gynecology

## 2020-01-19 DIAGNOSIS — Z1231 Encounter for screening mammogram for malignant neoplasm of breast: Secondary | ICD-10-CM | POA: Diagnosis not present

## 2020-10-02 IMAGING — MG DIGITAL DIAGNOSTIC BILATERAL MAMMOGRAM WITH TOMO AND CAD
8 series · 8 of 24 positions shown · non-contrast
Comparison: Previous exam(s).

CLINICAL DATA: Short-term interval follow-up of a probable benign
asymmetry in the left breast.

EXAM:
DIGITAL DIAGNOSTIC BILATERAL MAMMOGRAM WITH CAD AND TOMO

[R MLO synth-2D]
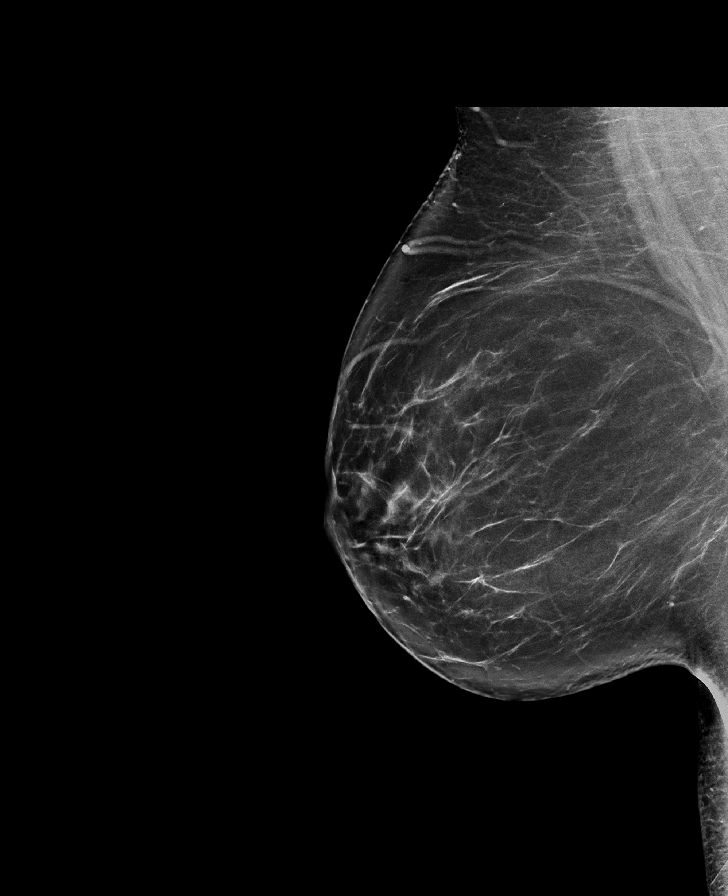

[L MLO synth-2D]
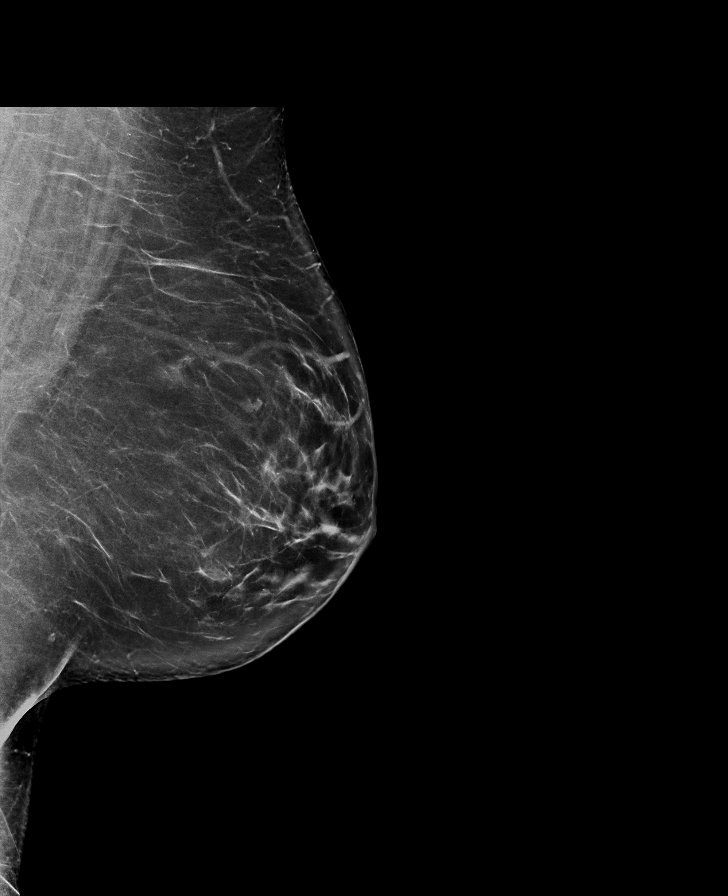

[L CC synth-2D]
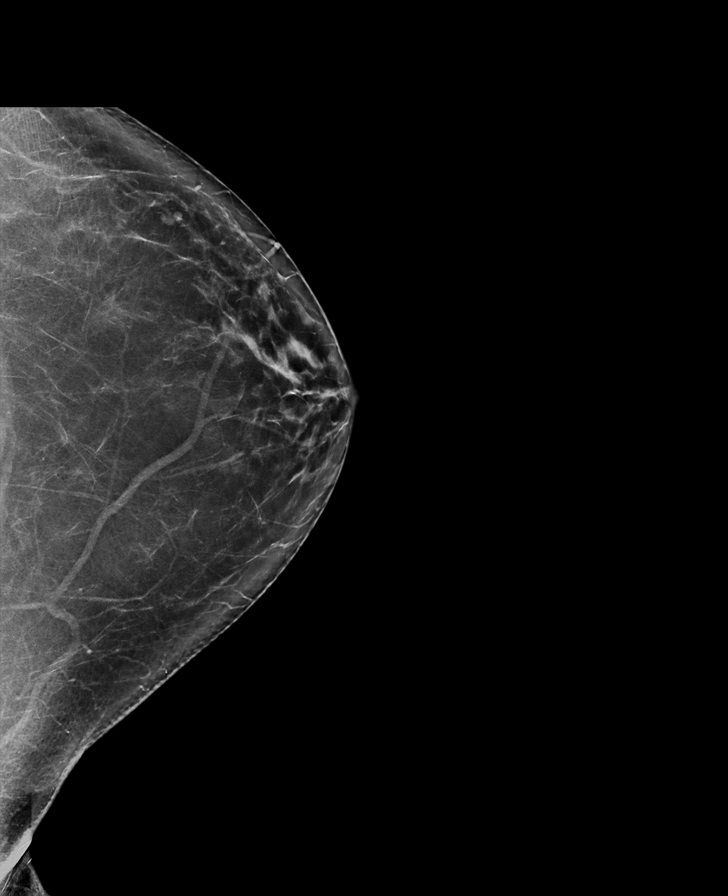

[R CC synth-2D]
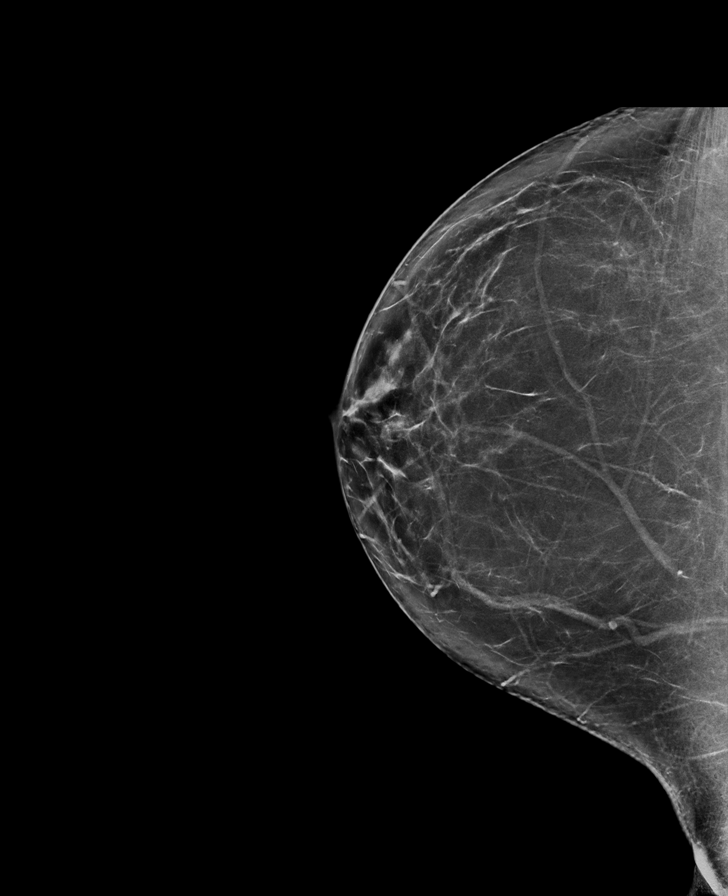

[R MLO tomo · tomo slice 49/97.0]
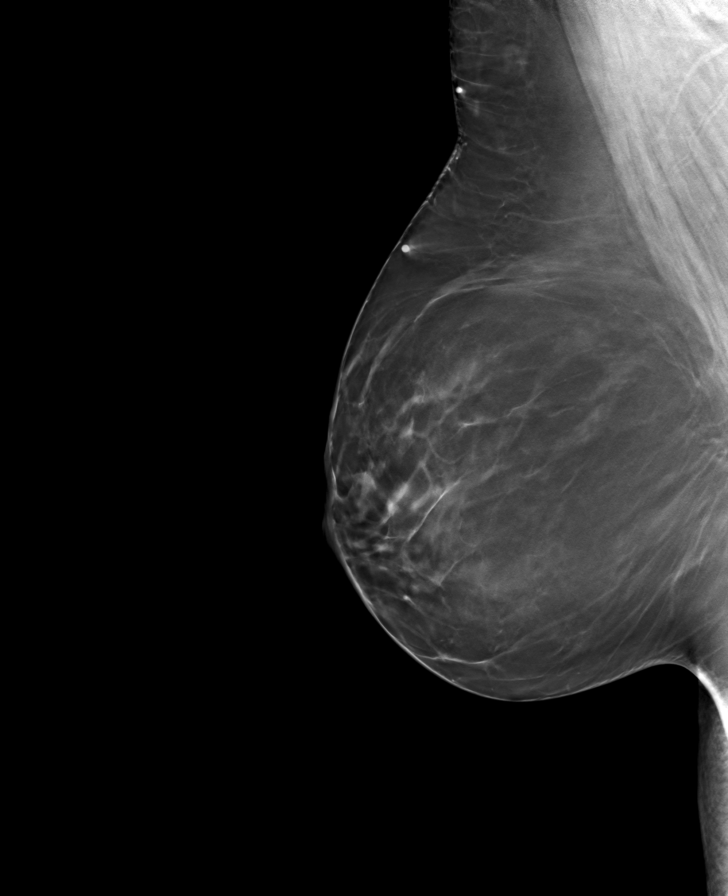

[L MLO tomo · tomo slice 46/91.0]
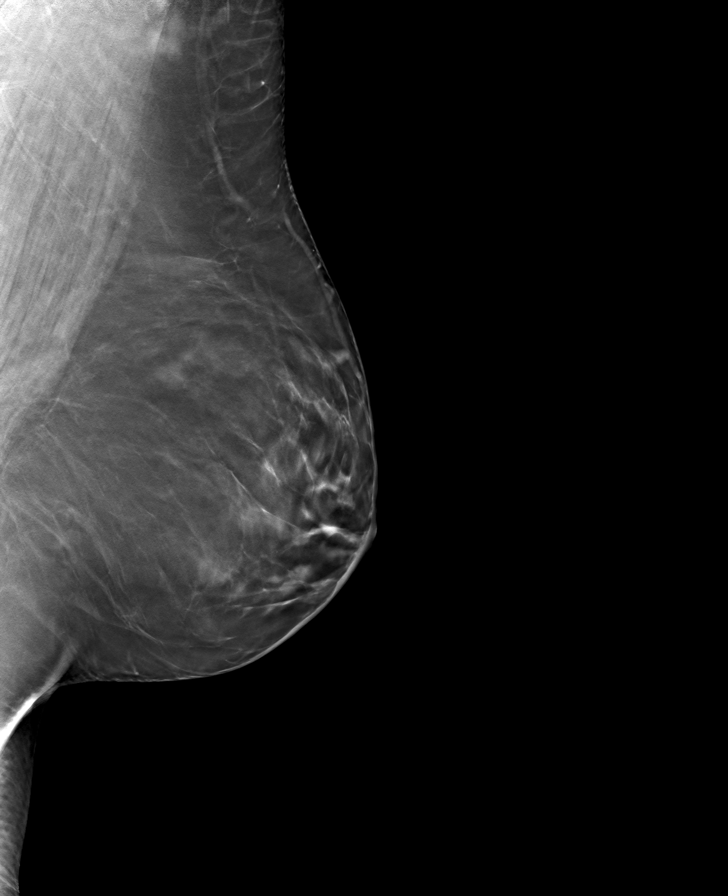

[L CC tomo · tomo slice 43/86.0]
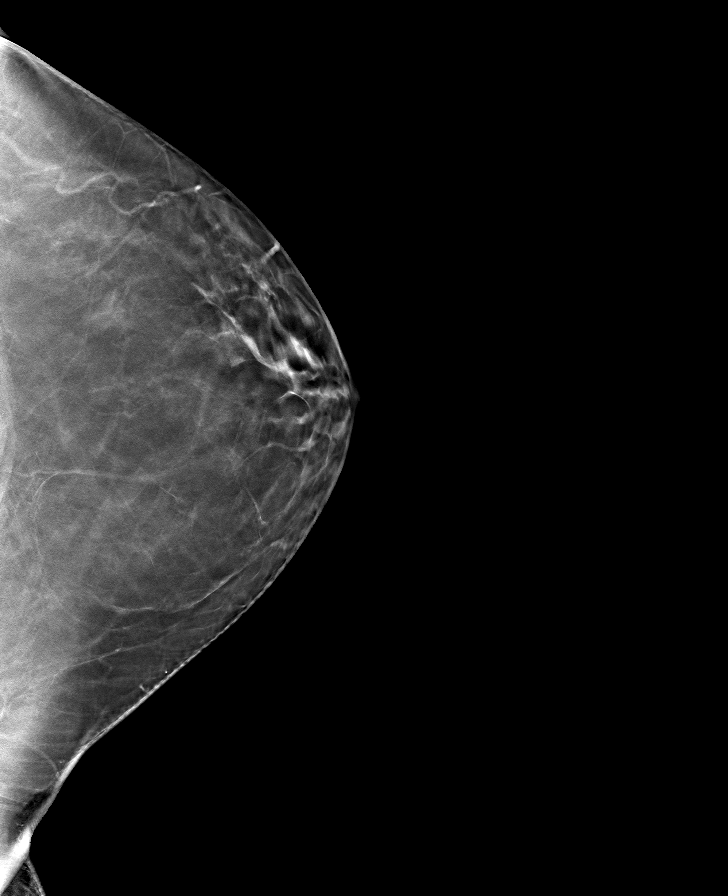

[R CC tomo · tomo slice 45/89.0]
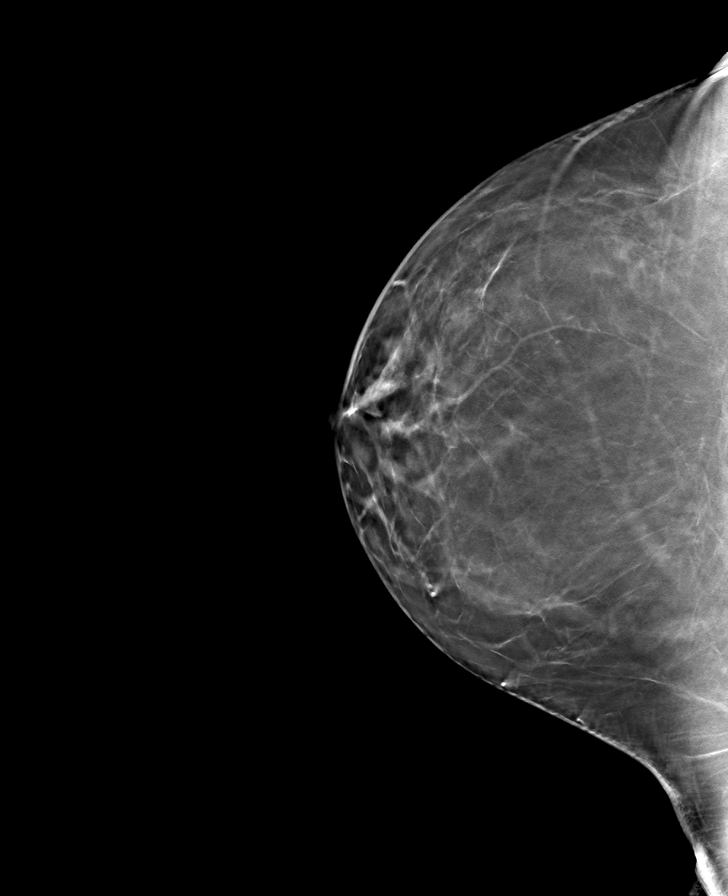

[8 of 24 positions shown; findings below may reference images not displayed]

ACR Breast Density Category b: There are scattered areas of
fibroglandular density.
FINDINGS: Asymmetry in the upper-outer quadrant of the left breast is stable
compared to the prior exam dated 12/12/2017. No suspicious mass or
malignant type microcalcifications identified in either breast.

Mammographic images were processed with CAD.
IMPRESSION: Stable benign-appearing asymmetry in the left breast.

RECOMMENDATION:
Bilateral diagnostic mammogram in 1 year is recommended to document
stability of the probable benign asymmetry in the left breast for 2
years.

I have discussed the findings and recommendations with the patient.
Results were also provided in writing at the conclusion of the
visit. If applicable, a reminder letter will be sent to the patient
regarding the next appointment.

BI-RADS CATEGORY  3: Probably benign.

## 2021-06-26 DIAGNOSIS — R21 Rash and other nonspecific skin eruption: Secondary | ICD-10-CM | POA: Diagnosis not present

## 2022-01-16 DIAGNOSIS — Z6834 Body mass index (BMI) 34.0-34.9, adult: Secondary | ICD-10-CM | POA: Diagnosis not present

## 2022-01-16 DIAGNOSIS — Z01419 Encounter for gynecological examination (general) (routine) without abnormal findings: Secondary | ICD-10-CM | POA: Diagnosis not present

## 2022-02-28 DIAGNOSIS — Z13220 Encounter for screening for lipoid disorders: Secondary | ICD-10-CM | POA: Diagnosis not present

## 2022-02-28 DIAGNOSIS — Z130 Encounter for screening for diseases of the blood and blood-forming organs and certain disorders involving the immune mechanism: Secondary | ICD-10-CM | POA: Diagnosis not present

## 2022-02-28 DIAGNOSIS — Z1231 Encounter for screening mammogram for malignant neoplasm of breast: Secondary | ICD-10-CM | POA: Diagnosis not present

## 2022-02-28 DIAGNOSIS — Z13228 Encounter for screening for other metabolic disorders: Secondary | ICD-10-CM | POA: Diagnosis not present

## 2022-02-28 DIAGNOSIS — Z131 Encounter for screening for diabetes mellitus: Secondary | ICD-10-CM | POA: Diagnosis not present

## 2022-02-28 DIAGNOSIS — Z1321 Encounter for screening for nutritional disorder: Secondary | ICD-10-CM | POA: Diagnosis not present

## 2022-02-28 DIAGNOSIS — Z1329 Encounter for screening for other suspected endocrine disorder: Secondary | ICD-10-CM | POA: Diagnosis not present

## 2022-05-15 DIAGNOSIS — Z8249 Family history of ischemic heart disease and other diseases of the circulatory system: Secondary | ICD-10-CM | POA: Diagnosis not present

## 2022-05-16 ENCOUNTER — Other Ambulatory Visit (HOSPITAL_COMMUNITY): Payer: Self-pay | Admitting: Family Medicine

## 2022-05-16 DIAGNOSIS — Z8249 Family history of ischemic heart disease and other diseases of the circulatory system: Secondary | ICD-10-CM

## 2022-06-05 ENCOUNTER — Ambulatory Visit (HOSPITAL_COMMUNITY): Payer: BC Managed Care – PPO

## 2022-06-07 ENCOUNTER — Ambulatory Visit (HOSPITAL_COMMUNITY)
Admission: RE | Admit: 2022-06-07 | Discharge: 2022-06-07 | Disposition: A | Discharge status: Home / Self Care | Payer: Self-pay | Source: Ambulatory Visit | Attending: Family Medicine | Admitting: Family Medicine

## 2022-06-07 DIAGNOSIS — Z8249 Family history of ischemic heart disease and other diseases of the circulatory system: Secondary | ICD-10-CM | POA: Insufficient documentation

## 2022-07-02 DIAGNOSIS — E039 Hypothyroidism, unspecified: Secondary | ICD-10-CM | POA: Diagnosis not present

## 2022-07-11 DIAGNOSIS — F4323 Adjustment disorder with mixed anxiety and depressed mood: Secondary | ICD-10-CM | POA: Diagnosis not present

## 2022-08-15 DIAGNOSIS — Z13220 Encounter for screening for lipoid disorders: Secondary | ICD-10-CM | POA: Diagnosis not present

## 2022-08-15 DIAGNOSIS — Z6833 Body mass index (BMI) 33.0-33.9, adult: Secondary | ICD-10-CM | POA: Diagnosis not present

## 2022-08-15 DIAGNOSIS — E038 Other specified hypothyroidism: Secondary | ICD-10-CM | POA: Diagnosis not present

## 2022-08-15 DIAGNOSIS — Z23 Encounter for immunization: Secondary | ICD-10-CM | POA: Diagnosis not present

## 2022-08-15 DIAGNOSIS — Z0000 Encounter for general adult medical examination without abnormal findings: Secondary | ICD-10-CM | POA: Diagnosis not present

## 2022-11-11 DIAGNOSIS — M79661 Pain in right lower leg: Secondary | ICD-10-CM | POA: Diagnosis not present

## 2022-11-11 DIAGNOSIS — S86111A Strain of other muscle(s) and tendon(s) of posterior muscle group at lower leg level, right leg, initial encounter: Secondary | ICD-10-CM | POA: Diagnosis not present

## 2022-12-17 DIAGNOSIS — S86111A Strain of other muscle(s) and tendon(s) of posterior muscle group at lower leg level, right leg, initial encounter: Secondary | ICD-10-CM | POA: Diagnosis not present

## 2022-12-18 DIAGNOSIS — L578 Other skin changes due to chronic exposure to nonionizing radiation: Secondary | ICD-10-CM | POA: Diagnosis not present

## 2022-12-18 DIAGNOSIS — L821 Other seborrheic keratosis: Secondary | ICD-10-CM | POA: Diagnosis not present

## 2022-12-18 DIAGNOSIS — D1801 Hemangioma of skin and subcutaneous tissue: Secondary | ICD-10-CM | POA: Diagnosis not present

## 2022-12-18 DIAGNOSIS — D485 Neoplasm of uncertain behavior of skin: Secondary | ICD-10-CM | POA: Diagnosis not present

## 2022-12-18 DIAGNOSIS — L814 Other melanin hyperpigmentation: Secondary | ICD-10-CM | POA: Diagnosis not present

## 2023-01-07 DIAGNOSIS — R29898 Other symptoms and signs involving the musculoskeletal system: Secondary | ICD-10-CM | POA: Diagnosis not present

## 2023-01-07 DIAGNOSIS — M25571 Pain in right ankle and joints of right foot: Secondary | ICD-10-CM | POA: Diagnosis not present

## 2023-01-20 DIAGNOSIS — M25571 Pain in right ankle and joints of right foot: Secondary | ICD-10-CM | POA: Diagnosis not present

## 2023-01-20 DIAGNOSIS — R29898 Other symptoms and signs involving the musculoskeletal system: Secondary | ICD-10-CM | POA: Diagnosis not present

## 2023-03-25 DIAGNOSIS — L723 Sebaceous cyst: Secondary | ICD-10-CM | POA: Diagnosis not present

## 2023-08-20 DIAGNOSIS — Z124 Encounter for screening for malignant neoplasm of cervix: Secondary | ICD-10-CM | POA: Diagnosis not present

## 2023-08-20 DIAGNOSIS — Z1151 Encounter for screening for human papillomavirus (HPV): Secondary | ICD-10-CM | POA: Diagnosis not present

## 2023-08-20 DIAGNOSIS — Z01419 Encounter for gynecological examination (general) (routine) without abnormal findings: Secondary | ICD-10-CM | POA: Diagnosis not present

## 2023-08-20 DIAGNOSIS — Z6833 Body mass index (BMI) 33.0-33.9, adult: Secondary | ICD-10-CM | POA: Diagnosis not present

## 2023-08-20 DIAGNOSIS — Z1231 Encounter for screening mammogram for malignant neoplasm of breast: Secondary | ICD-10-CM | POA: Diagnosis not present

## 2023-09-03 DIAGNOSIS — E038 Other specified hypothyroidism: Secondary | ICD-10-CM | POA: Diagnosis not present

## 2023-09-03 DIAGNOSIS — Z0000 Encounter for general adult medical examination without abnormal findings: Secondary | ICD-10-CM | POA: Diagnosis not present

## 2023-09-03 DIAGNOSIS — Z6833 Body mass index (BMI) 33.0-33.9, adult: Secondary | ICD-10-CM | POA: Diagnosis not present

## 2023-10-07 DIAGNOSIS — M25551 Pain in right hip: Secondary | ICD-10-CM | POA: Diagnosis not present

## 2023-10-07 DIAGNOSIS — M545 Low back pain, unspecified: Secondary | ICD-10-CM | POA: Diagnosis not present

## 2023-12-18 DIAGNOSIS — L814 Other melanin hyperpigmentation: Secondary | ICD-10-CM | POA: Diagnosis not present

## 2023-12-18 DIAGNOSIS — L821 Other seborrheic keratosis: Secondary | ICD-10-CM | POA: Diagnosis not present

## 2023-12-18 DIAGNOSIS — L578 Other skin changes due to chronic exposure to nonionizing radiation: Secondary | ICD-10-CM | POA: Diagnosis not present

## 2024-01-08 DIAGNOSIS — R221 Localized swelling, mass and lump, neck: Secondary | ICD-10-CM | POA: Diagnosis not present

## 2024-01-08 DIAGNOSIS — Z6834 Body mass index (BMI) 34.0-34.9, adult: Secondary | ICD-10-CM | POA: Diagnosis not present

## 2024-02-10 DIAGNOSIS — R221 Localized swelling, mass and lump, neck: Secondary | ICD-10-CM | POA: Diagnosis not present
